# Patient Record
Sex: Male | Born: 1980 | Race: White | Hispanic: No | Marital: Single | Smoking: Never smoker
Health system: Southern US, Community
[De-identification: ages and names within clinical notes are randomized; demographics above are authoritative.]

## PROBLEM LIST (undated history)

## (undated) DIAGNOSIS — I10 Essential (primary) hypertension: Secondary | ICD-10-CM

---

## 2017-10-10 ENCOUNTER — Encounter (HOSPITAL_BASED_OUTPATIENT_CLINIC_OR_DEPARTMENT_OTHER): Payer: Self-pay | Admitting: Emergency Medicine

## 2017-10-10 ENCOUNTER — Other Ambulatory Visit: Payer: Self-pay

## 2017-10-10 ENCOUNTER — Emergency Department (HOSPITAL_BASED_OUTPATIENT_CLINIC_OR_DEPARTMENT_OTHER): Payer: BC Managed Care – PPO

## 2017-10-10 ENCOUNTER — Emergency Department (HOSPITAL_BASED_OUTPATIENT_CLINIC_OR_DEPARTMENT_OTHER)
Admission: EM | Admit: 2017-10-10 | Discharge: 2017-10-10 | Disposition: A | Discharge status: Home / Self Care | Payer: BC Managed Care – PPO | Attending: Emergency Medicine | Admitting: Emergency Medicine

## 2017-10-10 DIAGNOSIS — Y999 Unspecified external cause status: Secondary | ICD-10-CM | POA: Insufficient documentation

## 2017-10-10 DIAGNOSIS — S4991XA Unspecified injury of right shoulder and upper arm, initial encounter: Secondary | ICD-10-CM | POA: Diagnosis present

## 2017-10-10 DIAGNOSIS — Y9289 Other specified places as the place of occurrence of the external cause: Secondary | ICD-10-CM | POA: Insufficient documentation

## 2017-10-10 DIAGNOSIS — S61252A Open bite of right middle finger without damage to nail, initial encounter: Secondary | ICD-10-CM | POA: Insufficient documentation

## 2017-10-10 DIAGNOSIS — S43004A Unspecified dislocation of right shoulder joint, initial encounter: Secondary | ICD-10-CM

## 2017-10-10 DIAGNOSIS — I10 Essential (primary) hypertension: Secondary | ICD-10-CM | POA: Insufficient documentation

## 2017-10-10 DIAGNOSIS — S43014A Anterior dislocation of right humerus, initial encounter: Secondary | ICD-10-CM | POA: Insufficient documentation

## 2017-10-10 DIAGNOSIS — Y9389 Activity, other specified: Secondary | ICD-10-CM | POA: Diagnosis not present

## 2017-10-10 DIAGNOSIS — W540XXA Bitten by dog, initial encounter: Secondary | ICD-10-CM | POA: Insufficient documentation

## 2017-10-10 HISTORY — DX: Essential (primary) hypertension: I10

## 2017-10-10 MED ORDER — AMOXICILLIN-POT CLAVULANATE 875-125 MG PO TABS: 1.0000 | ORAL_TABLET | Freq: Two times a day (BID) | ORAL | 0 refills | Status: AC

## 2017-10-10 MED ORDER — PROPOFOL 10 MG/ML IV BOLUS
INTRAVENOUS | Status: AC | PRN
  Administered 2017-10-10 (×2): 45 mg via INTRAVENOUS
  Administered 2017-10-10: 30 mg via INTRAVENOUS
  Administered 2017-10-10 (×5): 45 mg via INTRAVENOUS

## 2017-10-10 MED ORDER — FENTANYL CITRATE (PF) 100 MCG/2ML IJ SOLN
50.0000 ug | Freq: Once | INTRAMUSCULAR | Status: AC
  Administered 2017-10-10: 50 ug via INTRAVENOUS
  Filled 2017-10-10: qty 2

## 2017-10-10 MED ORDER — PROPOFOL 10 MG/ML IV BOLUS
INTRAVENOUS | Status: AC
  Filled 2017-10-10: qty 20

## 2017-10-10 MED ORDER — PROPOFOL 10 MG/ML IV BOLUS
1.0000 mg/kg | Freq: Once | INTRAVENOUS | Status: AC
  Administered 2017-10-10: 90.7 mg via INTRAVENOUS
  Filled 2017-10-10: qty 20

## 2017-10-10 NOTE — Sedation Documentation (Signed)
Pt denies pain.

## 2017-10-10 NOTE — ED Notes (Signed)
Pt awake and alert, CAO x 4. Friend at bedside visiting

## 2017-10-10 NOTE — ED Notes (Signed)
ED Provider at bedside. 

## 2017-10-10 NOTE — ED Triage Notes (Signed)
R shoulder pain after breaking up a dog fight. Pt unable to raise arm.

## 2017-10-10 NOTE — ED Provider Notes (Signed)
[facility] EMERGENCY DEPARTMENT Provider Note   CSN: [id number] Arrival date & time: 10/10/17  1140     History   Chief Complaint Chief Complaint  Patient presents with  . Shoulder Injury    HPI Cole Lindsey is a 36 y.o. male.  HPI   Cole Lindsey is a 36 year old male with a history of hypertension who presents to the emergency department for evaluation of right shoulder injury.  Patient states that he was breaking up a dog fight earlier this morning and felt his shoulder pop.  States that he feels severe right shoulder pain which is constant.  Is unable to lift his arm above his head due to pain.  Has not taken any over-the-counter medications for his symptoms.  Reports that he also got bit by the dog in the third finger of his right hand.  Denies numbness, weakness, abdominal pain, nausea/vomiting, chest pain, shortness of breath, fever, arthralgias or wounds elsewhere. Reports his tetanus is up to date.  Reports he last drank coffee about 5 hours ago.  Has not eaten anything today.  Past Medical History:  Diagnosis Date  . Hypertension     There are no active problems to display for this patient.   History reviewed. No pertinent surgical history.     Home Medications    Prior to Admission medications   Not on File    Family History No family history on file.  Social History Social History   Tobacco Use  . Smoking status: Never Smoker  . Smokeless tobacco: Never Used  Substance Use Topics  . Alcohol use: Yes  . Drug use: No     Allergies   Patient has no known allergies.   Review of Systems Review of Systems  Constitutional: Negative for chills, fatigue and fever.  Eyes: Negative for visual disturbance.  Respiratory: Negative for shortness of breath.   Cardiovascular: Negative for chest pain.  Gastrointestinal: Negative for abdominal pain, nausea and vomiting.  Genitourinary: Negative for difficulty urinating.  Musculoskeletal:  Positive for arthralgias (right shoulder). Negative for gait problem and joint swelling.  Skin: Positive for wound (third finger right hand puncture). Negative for rash.  Neurological: Negative for headaches.  Psychiatric/Behavioral: Negative for agitation.     Physical Exam Updated Vital Signs BP (!) 163/117 (BP Location: Left Arm)   Pulse 86   Temp 98.2 F (36.8 C) (Oral)   Resp 18   Ht 5\' 6"  (1.676 m)   Wt 90.7 kg (200 lb)   SpO2 100%   BMI 32.28 kg/m   Physical Exam  Constitutional: He is oriented to person, place, and time. He appears well-developed and well-nourished. No distress.  HENT:  Head: Normocephalic and atraumatic.  Mouth/Throat: Oropharynx is clear and moist. No oropharyngeal exudate.  Eyes: Pupils are equal, round, and reactive to light. Right eye exhibits no discharge. Left eye exhibits no discharge.  Neck: Normal range of motion. Neck supple.  Cardiovascular: Normal rate, regular rhythm and intact distal pulses. Exam reveals no friction rub.  No murmur heard. Pulmonary/Chest: Effort normal and breath sounds normal. No stridor. No respiratory distress. He has no wheezes. He has no rales.  Abdominal: Soft. Bowel sounds are normal. There is no tenderness. There is no guarding.  Musculoskeletal:  Right shoulder with visible deformity. No erythema, edema, ecchymosis or break in skin. He cannot actively move shoulder in any direction due to pain. Radial pulses 2+ bilaterally.   Right third finger with two small puncture wounds. No  surrounding erythema, edema, induration. No active bleeding. No overlying tenderness.   Neurological: He is alert and oriented to person, place, and time. Coordination normal.  Sensation to light/sharp touch intact in all five finger pads of the right hand.   Skin: Skin is warm and dry. Capillary refill takes less than 2 seconds. He is not diaphoretic.  Psychiatric: He has a normal mood and affect. His behavior is normal.  Nursing note  and vitals reviewed.    ED Treatments / Results  Labs (all labs ordered are listed, but only abnormal results are displayed) Labs Reviewed - No data to display  EKG  EKG Interpretation None       Radiology Dg Shoulder Right  Result Date: 10/10/2017 CLINICAL DATA:  36 year old who injured his right shoulder while helping to break up a dog fight earlier today. Initial encounter. EXAM: RIGHT SHOULDER - 2+ VIEW COMPARISON:  None. FINDINGS: Anterior glenohumeral dislocation. No visible fractures. Acromioclavicular joint intact. IMPRESSION: Anterior glenohumeral dislocation without visible fractures. Electronically Signed   By: Hulan Saashomas  Lawrence M.D.   On: 10/10/2017 11:58    Procedures Procedures (including critical care time)  Medications Ordered in ED Medications  propofol (DIPRIVAN) 10 mg/mL bolus/IV push 90.7 mg (not administered)  fentaNYL (SUBLIMAZE) injection 50 mcg (not administered)     Initial Impression / Assessment and Plan / ED Course  I have reviewed the triage vital signs and the nursing notes.  Pertinent labs & imaging results that were available during my care of the patient were reviewed by me and considered in my medical decision making (see chart for details).      X-ray reveals anterior glenohumeral dislocation, no fracture. Shoulder reduced under conscious sedation by Dr. Verdie MosherLiu.  Post reduction x-rays show good alignment of the glenohumeral joint. He has intact pulses and sensation post-reduction.  Patient placed in a sling immobilizer.  He has 2 puncture marks on his third right finger.  No overlying erythema, warmth or induration to suggest infection. These wounds were irrigated and cleaned extensively. Patient will be discharged with Augmentin for antibiotic prophylaxis.  According to patient, his Tdap is up-to-date.   Have given patient information to follow-up with sports medicine.  Discussed return precautions and the patient agrees and voices  understanding.  His blood pressure was elevated in the emergency department today.  Counseled him to have this rechecked.  Patient agrees to the above plan and has no complaints prior to discharge.  Final Clinical Impressions(s) / ED Diagnoses   Final diagnoses:  Dislocation of right shoulder joint, initial encounter  Dog bite, initial encounter    ED Discharge Orders        Ordered    amoxicillin-clavulanate (AUGMENTIN) 875-125 MG tablet  Every 12 hours     10/10/17 1426       Lawrence MarseillesShrosbree, Keshauna Degraffenreid J, PA-C 10/10/17 2040    Lavera GuiseLiu, Dana Duo, MD 10/11/17 [id number]

## 2017-10-10 NOTE — ED Notes (Addendum)
Xray at bedside for post-reduction films. Shoulder immobilizer in place

## 2017-10-10 NOTE — ED Provider Notes (Signed)
Medical screening examination/treatment/procedure(s) were conducted as a shared visit with non-physician practitioner(s) and myself.  I personally evaluated the patient during the encounter.   EKG Interpretation None      Otherwise healthy 36 year old male who presents with right shoulder dislocation after he was picking up a fight between her dogs.  Denies any other injuries.  Extremity is neurovascularly intact.  X-ray visualized with anterior right shoulder dislocation.  Shoulder was reduced under conscious sedation.  Post x-ray visualized showing successful reduction.  Extremity neurovascularly intact after reduction.  Placed in sling and given sports medicine follow-up.  Patient also with puncture bite marks to the hand.  Wounds will be cleaned and patient will be empirically given Augmentin. Strict return and follow-up instructions reviewed. He expressed understanding of all discharge instructions and felt comfortable with the plan of care.   Procedures .Sedation Date/Time: 10/10/2017 3:39 PM Performed by: Lavera GuiseLiu, Jessly Lebeck Duo, MD Authorized by: Lavera GuiseLiu, Elier Zellars Duo, MD   Consent:    Consent obtained:  Verbal   Consent given by:  Patient   Risks discussed:  Allergic reaction, dysrhythmia, inadequate sedation, nausea, vomiting, respiratory compromise necessitating ventilatory assistance and intubation and prolonged hypoxia resulting in organ damage   Alternatives discussed:  Analgesia without sedation Indications:    Procedure performed:  Dislocation reduction   Procedure necessitating sedation performed by:  Physician performing sedation   Intended level of sedation:  Moderate (conscious sedation) Pre-sedation assessment:    ASA classification: class 1 - normal, healthy patient     Neck mobility: normal     Mouth opening:  3 or more finger widths   Thyromental distance:  4 finger widths   Mallampati score:  I - soft palate, uvula, fauces, pillars visible   Pre-sedation assessments completed  and reviewed: airway patency, cardiovascular function, hydration status, mental status, nausea/vomiting, pain level, respiratory function and temperature   Immediate pre-procedure details:    Reassessment: Patient reassessed immediately prior to procedure     Reviewed: vital signs and NPO status     Verified: bag valve mask available, emergency equipment available, intubation equipment available, IV patency confirmed, oxygen available and suction available   Procedure details (see MAR for exact dosages):    Preoxygenation:  Nasal cannula   Sedation:  Propofol   Intra-procedure monitoring:  Blood pressure monitoring, cardiac monitor, continuous capnometry and continuous pulse oximetry   Intra-procedure events: none     Total Provider sedation time (minutes):  35 Post-procedure details:    Attendance: Constant attendance by certified staff until patient recovered     Recovery: Patient returned to pre-procedure baseline     Post-sedation assessments completed and reviewed: airway patency, cardiovascular function, hydration status, mental status, nausea/vomiting, pain level and respiratory function     Patient is stable for discharge or admission: yes     Patient tolerance:  Tolerated well, no immediate complications Reduction of dislocation Date/Time: 10/10/2017 3:42 PM Performed by: Lavera GuiseLiu, Orlondo Holycross Duo, MD Authorized by: Lavera GuiseLiu, Michelina Mexicano Duo, MD  Consent: Verbal consent obtained. Written consent obtained. Risks and benefits: risks, benefits and alternatives were discussed Consent given by: patient Patient understanding: patient states understanding of the procedure being performed Patient consent: the patient's understanding of the procedure matches consent given Procedure consent: procedure consent matches procedure scheduled Relevant documents: relevant documents present and verified Imaging studies: imaging studies available Patient identity confirmed: verbally with patient and arm band Time out:  Immediately prior to procedure a "time out" was called to verify the correct patient, procedure, equipment,  support staff and site/side marked as required. Preparation: Patient was prepped and draped in the usual sterile fashion. Local anesthesia used: no  Anesthesia: Local anesthesia used: no  Sedation: Patient sedated: yes Sedation type: moderate (conscious) sedation Sedatives: propofol Vitals: Vital signs were monitored during sedation.  Patient tolerance: Patient tolerated the procedure well with no immediate complications     Lavera GuiseLiu, Rosaline Ezekiel Duo, MD 10/10/17 1544

## 2017-10-10 NOTE — Sedation Documentation (Signed)
Unable to assess pain due to sedation.  

## 2017-10-10 NOTE — ED Notes (Addendum)
Pt d/c with ride. CAO x 4. Shoulder immobilizer in place. Rx x 1 for augmentin and disc with xrays given for f[place]. Ambulatory in dept prior to d/c. Taken to car via wheelchair. Pt's friend is driving

## 2017-10-10 NOTE — Discharge Instructions (Signed)
Keep your arm in the sling until you are seen by the orthopedic doctor.  Please gently wash your wound with warm soapy water daily.  We will try to keep it covered when you are outside or using her hands.  I have prescribed you a antibiotic medication to help prevent infection.  Please take this twice a day for the next 7 days.  Please take all of your antibiotics until finished!   You may develop abdominal discomfort or diarrhea from the antibiotic.  You may help offset this with probiotics which you can buy or get in yogurt. Do not eat  or take the probiotics until 2 hours after your antibiotic.   Return to the emergency department if you notice any redness or swelling at the fingers, if you have fever greater than 100.4 F or have any new or worsening symptoms.

## 2017-10-10 NOTE — ED Notes (Signed)
Attempted IV x 2  - patient very anxious about needles

## 2017-10-10 NOTE — ED Notes (Signed)
Shrosbree PA at bedside. Water given with verbal approval from her

## 2017-10-10 NOTE — Sedation Documentation (Addendum)
Pt denies pain.

## 2019-03-04 IMAGING — CR DG SHOULDER 2+V*R*
2 series · 2 of 2 positions shown · non-contrast
Comparison: None.

CLINICAL DATA: 36-year-old who injured his right shoulder while
helping to break up a dog fight earlier today. Initial encounter.

EXAM:
RIGHT SHOULDER - 2+ VIEW

[w shoulder grashey right]
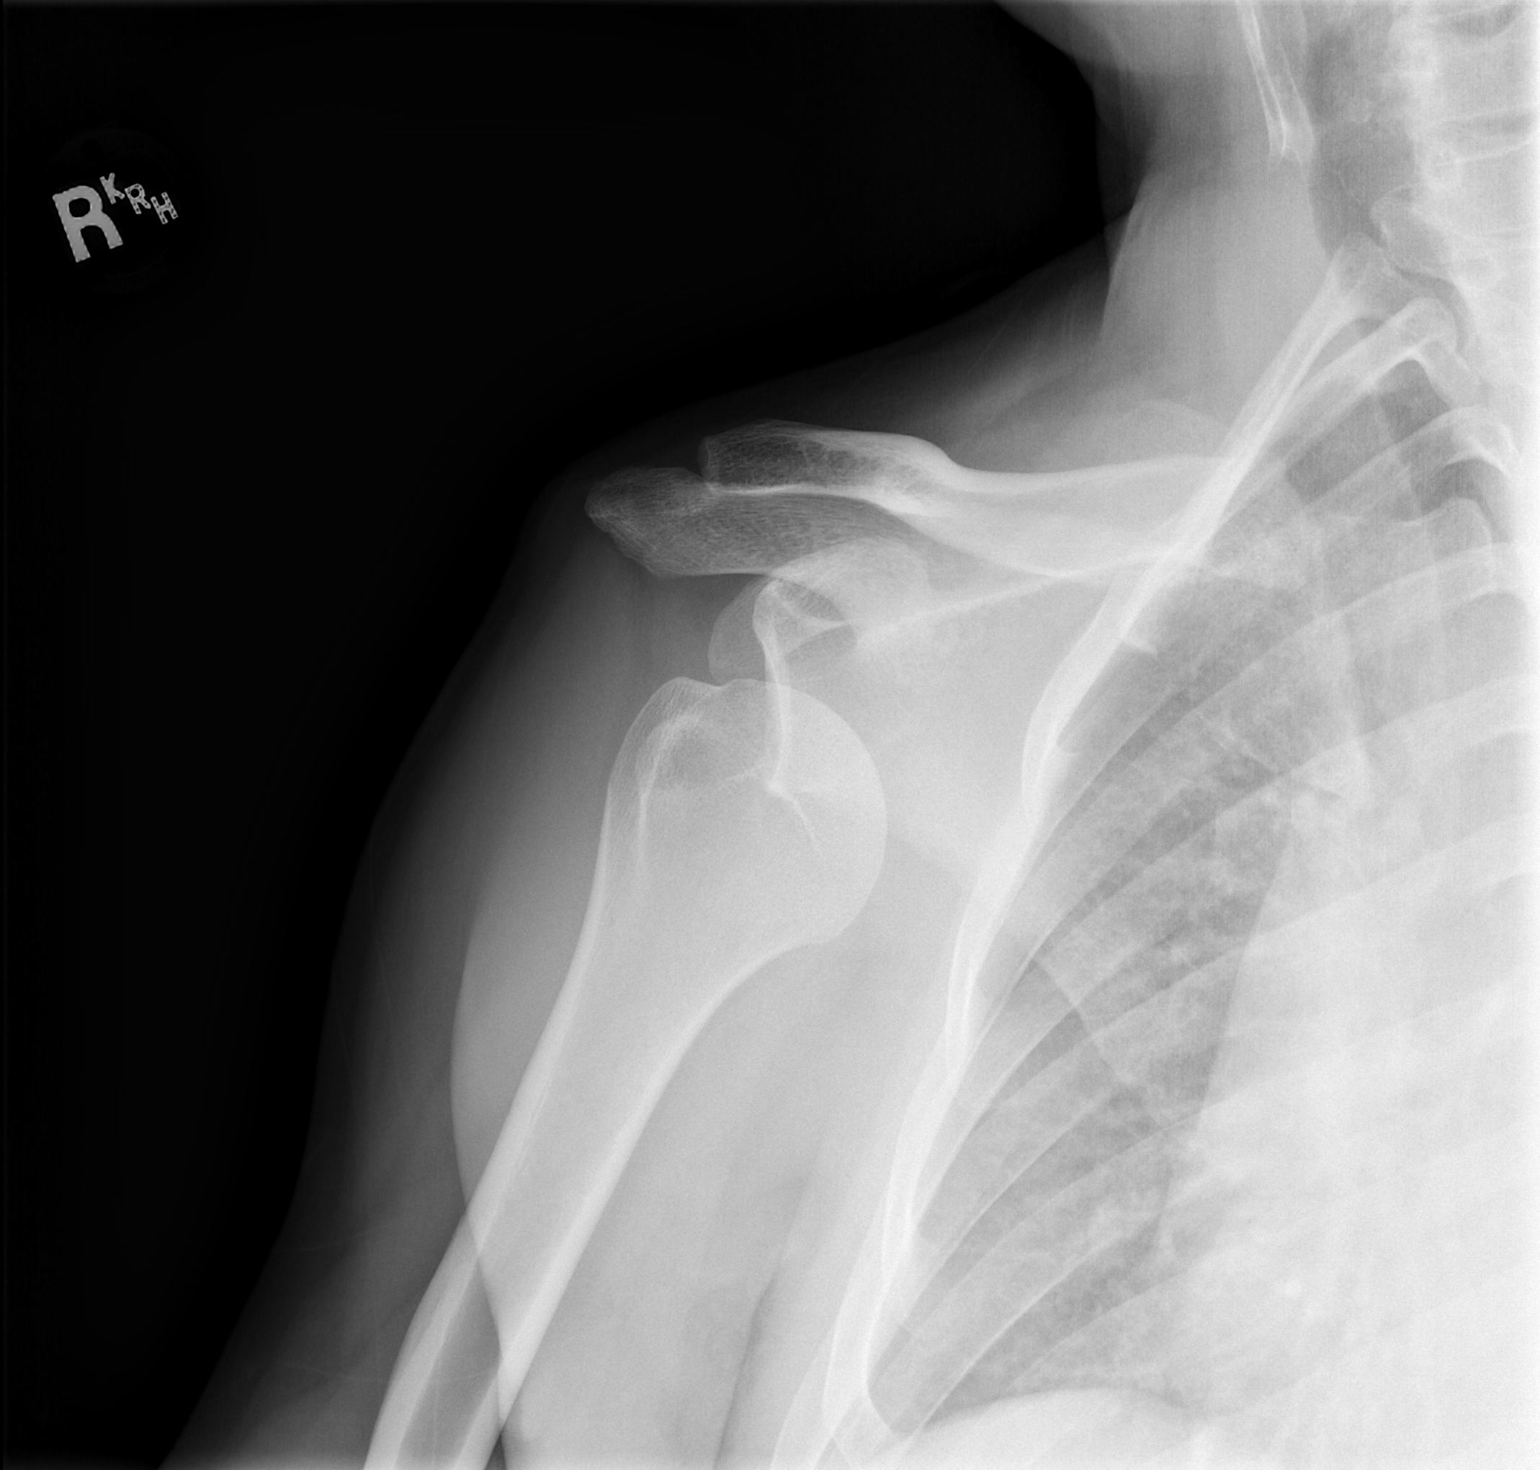

[w shoulder y view right]
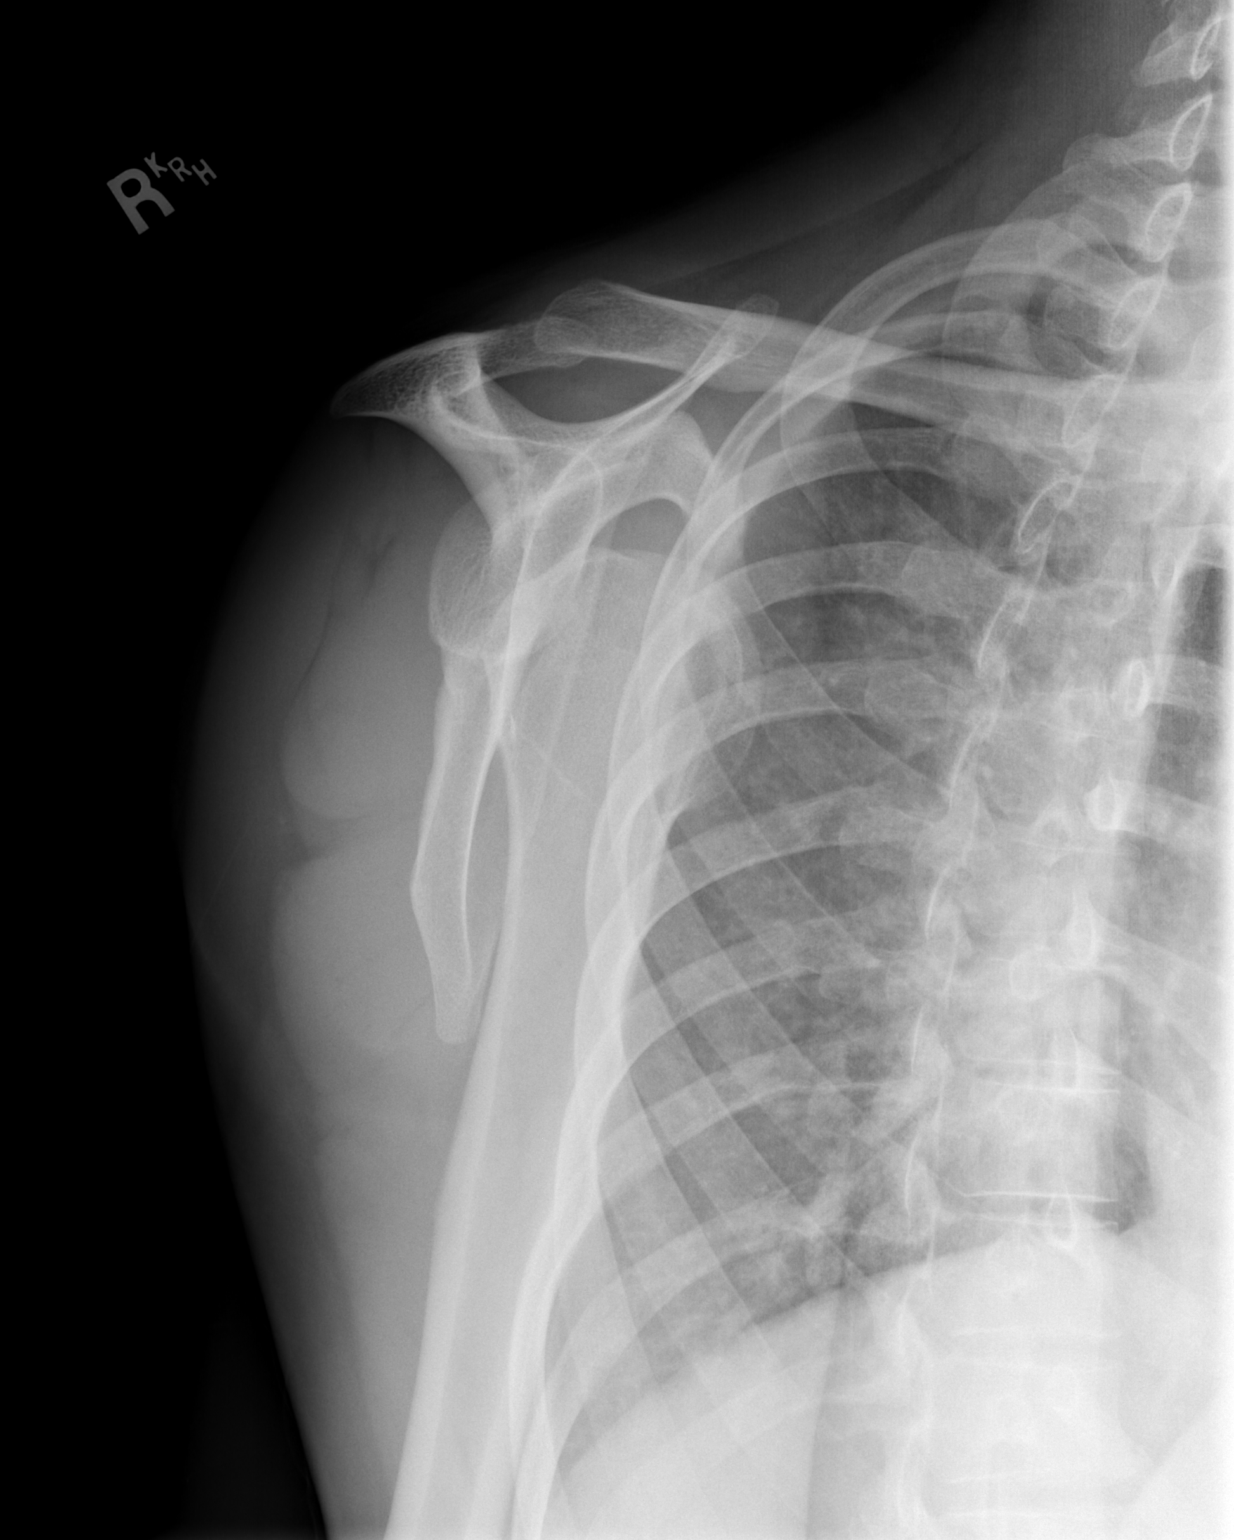

[2 of 2 positions shown; findings below may reference images not displayed]

FINDINGS: Anterior glenohumeral dislocation. No visible fractures.
Acromioclavicular joint intact.
IMPRESSION: Anterior glenohumeral dislocation without visible fractures.

## 2019-03-04 IMAGING — DX DG SHOULDER 2+V PORT*R*
1 series · 1 of 1 positions shown · non-contrast
Comparison: 10/10/2017

CLINICAL DATA: Postreduction

EXAM:
PORTABLE RIGHT SHOULDER

[shoulder grashey]
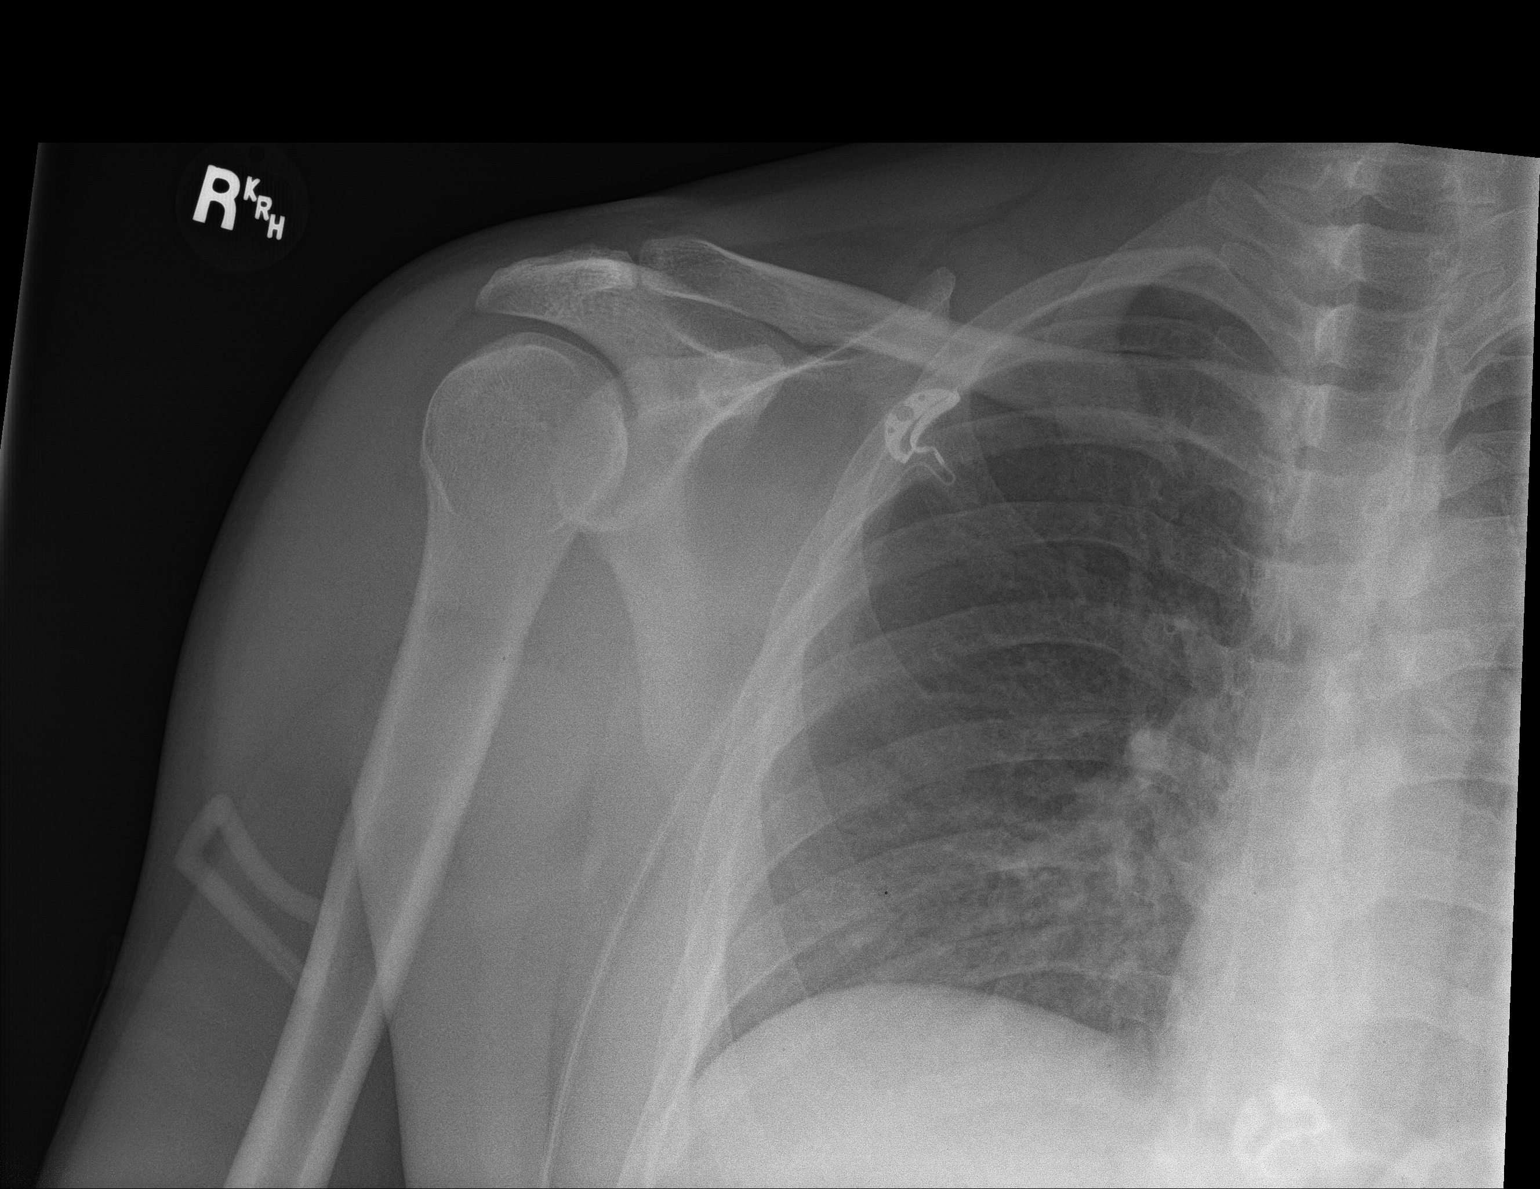

[1 of 1 positions shown; findings below may reference images not displayed]

FINDINGS: Interval reduction of the dislocated right shoulder. Normal AP
alignment. No visible fracture.
IMPRESSION: Normal AP alignment.  No visible fracture.
